# Patient Record
Sex: Male | Born: 1980 | Race: Black or African American | Hispanic: No | Marital: Single | Smoking: Current every day smoker
Health system: Southern US, Community
[De-identification: ages and names within clinical notes are randomized; demographics above are authoritative.]

---

## 2018-09-29 ENCOUNTER — Emergency Department (HOSPITAL_COMMUNITY)
Admission: EM | Admit: 2018-09-29 | Discharge: 2018-09-29 | Disposition: A | Discharge status: Home / Self Care | Payer: Self-pay | Attending: Emergency Medicine | Admitting: Emergency Medicine

## 2018-09-29 ENCOUNTER — Encounter (HOSPITAL_COMMUNITY): Payer: Self-pay | Admitting: *Deleted

## 2018-09-29 ENCOUNTER — Other Ambulatory Visit: Payer: Self-pay

## 2018-09-29 DIAGNOSIS — F17200 Nicotine dependence, unspecified, uncomplicated: Secondary | ICD-10-CM | POA: Insufficient documentation

## 2018-09-29 DIAGNOSIS — K0889 Other specified disorders of teeth and supporting structures: Secondary | ICD-10-CM | POA: Insufficient documentation

## 2018-09-29 MED ORDER — HYDROCODONE-ACETAMINOPHEN 5-325 MG PO TABS
1.0000 | ORAL_TABLET | Freq: Once | ORAL | Status: AC
  Administered 2018-09-29: 1 via ORAL
  Filled 2018-09-29: qty 1

## 2018-09-29 MED ORDER — PENICILLIN V POTASSIUM 500 MG PO TABS: 500.0000 mg | ORAL_TABLET | Freq: Four times a day (QID) | ORAL | 0 refills | Status: AC

## 2018-09-29 NOTE — ED Triage Notes (Signed)
Pt reports Lt bottom dental pain for 2 weeks . Pt has taken OTC with out relief.

## 2018-09-29 NOTE — ED Provider Notes (Signed)
[facility] EMERGENCY DEPARTMENT Provider Note   CSN: [id number] Arrival date & time: 09/29/18  1022    History   Chief Complaint Chief Complaint  Patient presents with  . Dental Pain    HPI Terry Campbell is a 38 y.o. male.     The history is provided by the patient and medical records. No language interpreter was used.  Dental Pain  Associated symptoms: no fever     Terry Campbell is a 38 y.o. male who presents to the Emergency Department complaining of persistent, gradually worsening, right-sided, lower dental pain beginning last week.  Patient states that he has intermittently had pain to this tooth throughout the last 2 to 3 years, however pain would just come and go for a few hours at a time every few months.  Of the last week, pain has been constant and described as throbbing.  Patient has been taking ibuprofen with no improvement in his symptoms.  Has not seen dentistry.  Patient denies facial swelling, fever, difficulty breathing or swallowing.  History reviewed. No pertinent past medical history.  There are no active problems to display for this patient.   History reviewed. No pertinent surgical history.      Home Medications    Prior to Admission medications   Medication Sig Start Date End Date Taking? Authorizing Provider  penicillin v potassium (VEETID) 500 MG tablet Take 1 tablet (500 mg total) by mouth 4 (four) times daily for 7 days. 09/29/18 10/06/18  Shyra Emile, Chase Picket, PA-C    Family History History reviewed. No pertinent family history.  Social History Social History   Tobacco Use  . Smoking status: Current Every Day Smoker  . Smokeless tobacco: Never Used  Substance Use Topics  . Alcohol use: Yes    Alcohol/week: 1.0 standard drinks    Types: 1 Glasses of wine per week    Comment: social  . Drug use: Yes    Frequency: 3.0 times per week    Types: Marijuana     Allergies   Patient has no allergy information on  record.   Review of Systems Review of Systems  Constitutional: Negative for fever.  HENT: Positive for dental problem. Negative for trouble swallowing.   Respiratory: Negative for cough and shortness of breath.      Physical Exam Updated Vital Signs BP 135/78 (BP Location: Right Arm)   Pulse (!) 58   Temp 98 F (36.7 C) (Oral)   Resp 16   Ht 6\' 4"  (1.93 m)   Wt 90.7 kg   SpO2 99%   BMI 24.34 kg/m   Physical Exam Vitals signs and nursing note reviewed.  Constitutional:      General: He is not in acute distress.    Appearance: He is well-developed.  HENT:     Head: Normocephalic and atraumatic.     Mouth/Throat:      Comments: Pain along tooth  as depicted in image. No abscess noted. Midline uvula. No trismus. OP moist and clear. No oropharyngeal erythema or edema. Neck supple with no tenderness. No facial edema. Neck:     Musculoskeletal: Neck supple.  Cardiovascular:     Rate and Rhythm: Normal rate and regular rhythm.     Heart sounds: Normal heart sounds. No murmur.  Pulmonary:     Effort: Pulmonary effort is normal. No respiratory distress.     Breath sounds: Normal breath sounds. No wheezing or rales.  Musculoskeletal: Normal range of motion.  Skin:  General: Skin is warm and dry.  Neurological:     Mental Status: He is alert.      ED Treatments / Results  Labs (all labs ordered are listed, but only abnormal results are displayed) Labs Reviewed - No data to display  EKG None  Radiology No results found.  Procedures Procedures (including critical care time)  Medications Ordered in ED Medications  HYDROcodone-acetaminophen (NORCO/VICODIN) 5-325 MG per tablet 1 tablet (1 tablet Oral Given 09/29/18 1054)     Initial Impression / Assessment and Plan / ED Course  I have reviewed the triage vital signs and the nursing notes.  Pertinent labs & imaging results that were available during my care of the patient were reviewed by me and considered in  my medical decision making (see chart for details).       Terry Campbell is a 38 y.o. male who presents to ED for dental pain. No abscess requiring immediate incision and drainage. Patient is afebrile, non toxic appearing, and swallowing secretions well. Exam not concerning for Ludwig's angina or pharyngeal abscess. Will treat with PenVK. I provided dental resource guide and stressed the importance of dental follow up for ultimate management of dental pain. Patient voices understanding and is agreeable to plan.   Final Clinical Impressions(s) / ED Diagnoses   Final diagnoses:  Pain, dental    ED Discharge Orders         Ordered    penicillin v potassium (VEETID) 500 MG tablet  4 times daily     09/29/18 1042           Manna Gose, Chase Picket, PA-C 09/29/18 1057    Gerhard Munch, MD 09/30/18 [phone]

## 2018-09-29 NOTE — ED Triage Notes (Signed)
PT drove from [place] one week ago to [place] .

## 2018-09-29 NOTE — ED Notes (Signed)
Pt states he has a ride home

## 2018-09-29 NOTE — Discharge Instructions (Signed)
You have a dental infection. It is very important that you get evaluated by a dentist as soon as possible. Call tomorrow to schedule an appointment. Alternate between Tylenol and Ibuprofen as needed for pain. Take your full course of antibiotics. Read the instructions below.  Eat a soft or liquid diet and rinse your mouth out after meals with warm water. You should see a dentist or return here at once if you have increased swelling, increased pain or uncontrolled bleeding from the site of your injury.  SEEK MEDICAL CARE IF:  You have increased pain not controlled with medicines.  You have swelling around your tooth, in your face or neck.  You have bleeding which starts, continues, or gets worse.  You have a fever >101 If you are unable to open your mouth

## 2018-09-30 ENCOUNTER — Encounter (HOSPITAL_COMMUNITY): Payer: Self-pay

## 2018-09-30 ENCOUNTER — Emergency Department (HOSPITAL_COMMUNITY): Payer: Self-pay

## 2018-09-30 ENCOUNTER — Other Ambulatory Visit: Payer: Self-pay

## 2018-09-30 ENCOUNTER — Emergency Department (HOSPITAL_COMMUNITY)
Admission: EM | Admit: 2018-09-30 | Discharge: 2018-09-30 | Disposition: A | Discharge status: Home / Self Care | Payer: Self-pay | Attending: Emergency Medicine | Admitting: Emergency Medicine

## 2018-09-30 DIAGNOSIS — F17210 Nicotine dependence, cigarettes, uncomplicated: Secondary | ICD-10-CM | POA: Insufficient documentation

## 2018-09-30 DIAGNOSIS — K0889 Other specified disorders of teeth and supporting structures: Secondary | ICD-10-CM | POA: Insufficient documentation

## 2018-09-30 MED ORDER — LIDOCAINE-EPINEPHRINE 1 %-1:100000 IJ SOLN
20.0000 mL | Freq: Once | INTRAMUSCULAR | Status: AC
  Administered 2018-09-30: 20 mL
  Filled 2018-09-30: qty 20

## 2018-09-30 MED ORDER — HYDROCODONE-ACETAMINOPHEN 5-325 MG PO TABS: 1.0000 | ORAL_TABLET | Freq: Four times a day (QID) | ORAL | 0 refills | Status: AC | PRN

## 2018-09-30 NOTE — Consult Note (Signed)
Reason for Consult:Dental pain Referring Physician: ER  Terry Campbell is an 38 y.o. male.  GE:XBMW left lower molar and upper left molar   HPI: ER visit 09/29/2018 rx'd pcn. No relief of symptoms  History reviewed. No pertinent past medical history.  History reviewed. No pertinent surgical history.  History reviewed. No pertinent family history.  Social History:  reports that he has been smoking. He has never used smokeless tobacco. He reports current alcohol use of about 1.0 standard drinks of alcohol per week. He reports current drug use. Frequency: 3.00 times per week. Drug: Marijuana.  Allergies: No Known Allergies  Medications: I have reviewed the patient's current medications.  No results found for this or any previous visit (from the past 48 hour(s)).  Dg Orthopantogram  Result Date: 09/30/2018 CLINICAL DATA:  Bilateral jaw pain. EXAM: ORTHOPANTOGRAM/PANORAMIC COMPARISON:  None. FINDINGS: No fracture or bone lesion. Temporomandibular joints appear normally aligned. There are prominent dental caries of the third maxillary molars bilaterally, more extensive on the right. No periapical lucency to suggest a root abscess. No other visualized dental carie. Soft tissues are unremarkable. IMPRESSION: 1. Dental caries the maxillary third molars, right more extensive than left. Electronically Signed   By: Terry Campbell M.D.   On: 09/30/2018 13:29    ROS Blood pressure 133/84, pulse 74, temperature 97.9 F (36.6 C), temperature source Oral, resp. rate 18, SpO2 94 %. General appearance: alert, cooperative and no distress Head: Normocephalic, without obvious abnormality, atraumatic Eyes: negative Nose: Nares normal. Septum midline. Mucosa normal. No drainage or sinus tenderness. Throat: lips, mucosa, and tongue normal; teeth and gums normal and large carious lesion tooth #16, 18. impacted tooth #17. No purulence, trismus.  Neck: no adenopathy, supple, symmetrical, trachea midline and  thyroid not enlarged, symmetric, no tenderness/mass/nodules  Assessment/Plan: Nonrestorable teeth # 16, 18, impacted tooth #17. Plan extraction with local.  Terry Campbell 09/30/2018, 2:50 PM

## 2018-09-30 NOTE — Discharge Instructions (Addendum)
Please read attached information. If you experience any new or worsening signs or symptoms please return to the emergency room for evaluation. Please follow-up with your primary care provider or specialist as discussed. Please use medication prescribed only as directed and discontinue taking if you have any concerning signs or symptoms.   °

## 2018-09-30 NOTE — ED Triage Notes (Addendum)
Pt endorses back left tooth pain. Pt referred here by Barbette Merino, MD. Pt endorses pain x4 days.

## 2018-09-30 NOTE — Procedures (Signed)
2% lidocaine 1:100,000 epi. 8cc's given. Extraction teeth 17, 18. Bone removed with Stryker drill. Teeth sectioned, removed roots individually. 4-0 chromic sutures. Forcep extraction tooth #16. Disto buccal root removed with rongeurs.  Patient tolerated well. Postop instructions given.

## 2018-09-30 NOTE — ED Notes (Signed)
Patient verbalizes understanding of discharge instructions. Opportunity for questioning and answers were provided. Armband removed by staff, pt discharged from ED. Prescriptions and pharmacy reviewed.

## 2018-09-30 NOTE — ED Provider Notes (Signed)
[facility] EMERGENCY DEPARTMENT Provider Note   CSN: [id number] Arrival date & time: 09/30/18  1241    History   Chief Complaint Chief Complaint  Patient presents with  . Dental Pain    HPI Terry Campbell is a 38 y.o. male.     HPI   38 year old male presents today with complaints of dental pain.  Patient notes approximate 3-day history of left lower posterior dental pain.  Patient notes this is around his left third molar.  He denies any infection, fever.  He notes he was seen yesterday and referred to oral surgery.  He spoke with the oral surgeon who would see him here in the hospital setting.  He is taking ibuprofen and topical numbing gel at home.  He denies any trauma.  He notes this has been intermittent for several years but worsened recently.  History reviewed. No pertinent past medical history.  There are no active problems to display for this patient.   History reviewed. No pertinent surgical history.      Home Medications    Prior to Admission medications   Medication Sig Start Date End Date Taking? Authorizing Provider  penicillin v potassium (VEETID) 500 MG tablet Take 1 tablet (500 mg total) by mouth 4 (four) times daily for 7 days. 09/29/18 10/06/18  Ward, Chase Picket, PA-C    Family History History reviewed. No pertinent family history.  Social History Social History   Tobacco Use  . Smoking status: Current Every Day Smoker  . Smokeless tobacco: Never Used  Substance Use Topics  . Alcohol use: Yes    Alcohol/week: 1.0 standard drinks    Types: 1 Glasses of wine per week    Comment: social  . Drug use: Yes    Frequency: 3.0 times per week    Types: Marijuana     Allergies   Patient has no known allergies.   Review of Systems Review of Systems  All other systems reviewed and are negative.    Physical Exam Updated Vital Signs BP 133/84 (BP Location: Right Arm)   Pulse 74   Temp 97.9 F (36.6 C) (Oral)   Resp 18    SpO2 94%   Physical Exam Vitals signs and nursing note reviewed.  Constitutional:      Appearance: He is well-developed.  HENT:     Head: Normocephalic and atraumatic.     Comments: Impacted left third molar no surrounding swelling, no facial asymmetry no trismus Eyes:     General: No scleral icterus.       Right eye: No discharge.        Left eye: No discharge.     Conjunctiva/sclera: Conjunctivae normal.     Pupils: Pupils are equal, round, and reactive to light.  Neck:     Musculoskeletal: Normal range of motion.     Vascular: No JVD.     Trachea: No tracheal deviation.  Pulmonary:     Effort: Pulmonary effort is normal.     Breath sounds: No stridor.  Neurological:     Mental Status: He is alert and oriented to person, place, and time.     Coordination: Coordination normal.  Psychiatric:        Behavior: Behavior normal.        Thought Content: Thought content normal.        Judgment: Judgment normal.      ED Treatments / Results  Labs (all labs ordered are listed, but only abnormal results are  displayed) Labs Reviewed - No data to display  EKG None  Radiology Dg Orthopantogram  Result Date: 09/30/2018 CLINICAL DATA:  Bilateral jaw pain. EXAM: ORTHOPANTOGRAM/PANORAMIC COMPARISON:  None. FINDINGS: No fracture or bone lesion. Temporomandibular joints appear normally aligned. There are prominent dental caries of the third maxillary molars bilaterally, more extensive on the right. No periapical lucency to suggest a root abscess. No other visualized dental carie. Soft tissues are unremarkable. IMPRESSION: 1. Dental caries the maxillary third molars, right more extensive than left. Electronically Signed   By: Amie Portland M.D.   On: 09/30/2018 13:29    Procedures Procedures (including critical care time)  Medications Ordered in ED Medications  lidocaine-EPINEPHrine (XYLOCAINE W/EPI) 1 %-1:100000 (with pres) injection 20 mL (20 mLs Infiltration Given by Other  09/30/18 1336)     Initial Impression / Assessment and Plan / ED Course  I have reviewed the triage vital signs and the nursing notes.  Pertinent labs & imaging results that were available during my care of the patient were reviewed by me and considered in my medical decision making (see chart for details).        38 year old male presents today with complaints of dental pain.  Patient appears to have impacted left lower molar.  No acute signs of infection presently.  Discussed case with oral surgeon who will evaluate patient here for management.  Final Clinical Impressions(s) / ED Diagnoses   Final diagnoses:  Pain, dental  Chronic dental pain    ED Discharge Orders    None       Rosalio Loud 09/30/18 1355    Terrilee Files, MD 10/01/18 [phone]

## 2019-10-09 IMAGING — DX ORTHOPANTOGRAM/PANORAMIC
1 series · 1 of 1 positions shown · non-contrast
Comparison: None.

CLINICAL DATA: Bilateral jaw pain.

EXAM:
ORTHOPANTOGRAM/PANORAMIC

[view not recorded]
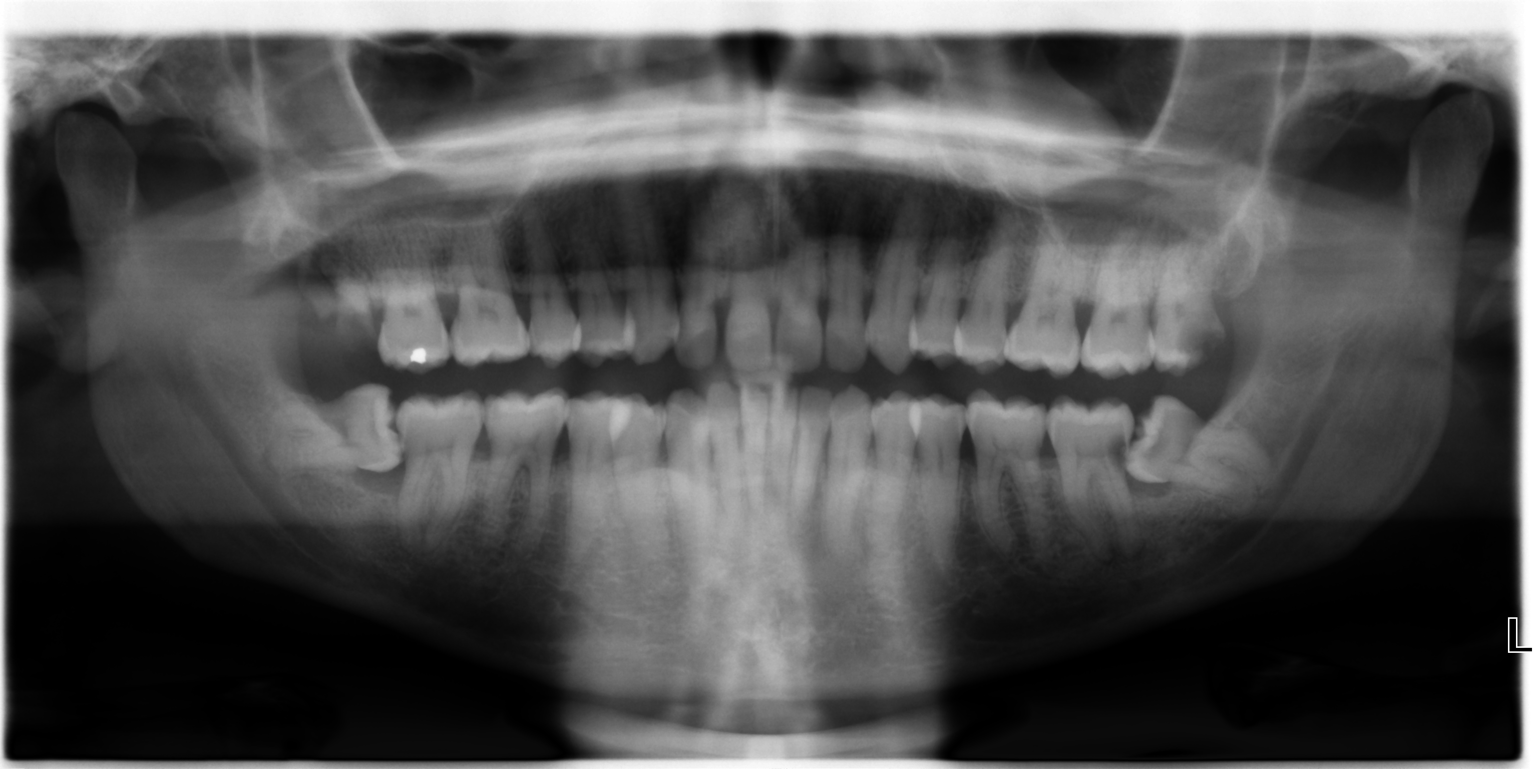

[1 of 1 positions shown; findings below may reference images not displayed]

FINDINGS: No fracture or bone lesion. Temporomandibular joints appear normally
aligned.

There are prominent dental caries of the third maxillary molars
bilaterally, more extensive on the right. No periapical lucency to
suggest a root abscess. No other visualized dental [place].

Soft tissues are unremarkable.
IMPRESSION: 1. Dental caries the maxillary third molars, right more extensive
than left.
